# Patient Record
Sex: Male | Born: 1955 | Race: White | Hispanic: No | Smoking: Current every day smoker
Health system: Southern US, Community
[De-identification: ages and names within clinical notes are randomized; demographics above are authoritative.]

## PROBLEM LIST (undated history)

## (undated) DIAGNOSIS — F0390 Unspecified dementia without behavioral disturbance: Secondary | ICD-10-CM

## (undated) DIAGNOSIS — F32A Depression, unspecified: Secondary | ICD-10-CM

## (undated) DIAGNOSIS — F329 Major depressive disorder, single episode, unspecified: Secondary | ICD-10-CM

## (undated) DIAGNOSIS — I10 Essential (primary) hypertension: Secondary | ICD-10-CM

## (undated) DIAGNOSIS — S069X9A Unspecified intracranial injury with loss of consciousness of unspecified duration, initial encounter: Secondary | ICD-10-CM

## (undated) DIAGNOSIS — Z720 Tobacco use: Secondary | ICD-10-CM

## (undated) DIAGNOSIS — F1910 Other psychoactive substance abuse, uncomplicated: Secondary | ICD-10-CM

## (undated) DIAGNOSIS — M1990 Unspecified osteoarthritis, unspecified site: Secondary | ICD-10-CM

## (undated) DIAGNOSIS — S069XAA Unspecified intracranial injury with loss of consciousness status unknown, initial encounter: Secondary | ICD-10-CM

## (undated) HISTORY — DX: Other psychoactive substance abuse, uncomplicated: F19.10

## (undated) HISTORY — DX: Depression, unspecified: F32.A

## (undated) HISTORY — PX: BACK SURGERY: SHX140

## (undated) HISTORY — DX: Essential (primary) hypertension: I10

## (undated) HISTORY — DX: Unspecified osteoarthritis, unspecified site: M19.90

## (undated) HISTORY — PX: OTHER SURGICAL HISTORY: SHX169

---
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
## 2019-02-15 ENCOUNTER — Other Ambulatory Visit: Payer: Self-pay

## 2019-02-15 ENCOUNTER — Encounter (HOSPITAL_COMMUNITY): Payer: Self-pay | Admitting: Emergency Medicine

## 2019-02-15 ENCOUNTER — Inpatient Hospital Stay (HOSPITAL_COMMUNITY)
Admission: EM | Admit: 2019-02-15 | Discharge: 2019-02-18 | DRG: 982 | Disposition: A | Discharge status: Home / Self Care | Payer: No Typology Code available for payment source | Attending: Internal Medicine | Admitting: Internal Medicine

## 2019-02-15 ENCOUNTER — Emergency Department (HOSPITAL_COMMUNITY): Payer: No Typology Code available for payment source

## 2019-02-15 DIAGNOSIS — F329 Major depressive disorder, single episode, unspecified: Secondary | ICD-10-CM | POA: Diagnosis present

## 2019-02-15 DIAGNOSIS — K922 Gastrointestinal hemorrhage, unspecified: Secondary | ICD-10-CM | POA: Diagnosis not present

## 2019-02-15 DIAGNOSIS — K920 Hematemesis: Secondary | ICD-10-CM | POA: Diagnosis present

## 2019-02-15 DIAGNOSIS — D72829 Elevated white blood cell count, unspecified: Secondary | ICD-10-CM | POA: Diagnosis present

## 2019-02-15 DIAGNOSIS — K226 Gastro-esophageal laceration-hemorrhage syndrome: Principal | ICD-10-CM | POA: Diagnosis present

## 2019-02-15 DIAGNOSIS — Y92410 Unspecified street and highway as the place of occurrence of the external cause: Secondary | ICD-10-CM

## 2019-02-15 DIAGNOSIS — K2970 Gastritis, unspecified, without bleeding: Secondary | ICD-10-CM | POA: Diagnosis present

## 2019-02-15 DIAGNOSIS — M1990 Unspecified osteoarthritis, unspecified site: Secondary | ICD-10-CM | POA: Diagnosis present

## 2019-02-15 DIAGNOSIS — Z720 Tobacco use: Secondary | ICD-10-CM | POA: Diagnosis present

## 2019-02-15 DIAGNOSIS — I700 Atherosclerosis of aorta: Secondary | ICD-10-CM | POA: Diagnosis present

## 2019-02-15 DIAGNOSIS — J439 Emphysema, unspecified: Secondary | ICD-10-CM | POA: Diagnosis present

## 2019-02-15 DIAGNOSIS — Z87820 Personal history of traumatic brain injury: Secondary | ICD-10-CM

## 2019-02-15 DIAGNOSIS — G4733 Obstructive sleep apnea (adult) (pediatric): Secondary | ICD-10-CM | POA: Diagnosis present

## 2019-02-15 DIAGNOSIS — S0101XA Laceration without foreign body of scalp, initial encounter: Secondary | ICD-10-CM | POA: Diagnosis present

## 2019-02-15 DIAGNOSIS — Z20828 Contact with and (suspected) exposure to other viral communicable diseases: Secondary | ICD-10-CM | POA: Diagnosis present

## 2019-02-15 DIAGNOSIS — R911 Solitary pulmonary nodule: Secondary | ICD-10-CM | POA: Diagnosis present

## 2019-02-15 DIAGNOSIS — K2210 Ulcer of esophagus without bleeding: Secondary | ICD-10-CM | POA: Diagnosis present

## 2019-02-15 DIAGNOSIS — M545 Low back pain: Secondary | ICD-10-CM | POA: Diagnosis present

## 2019-02-15 DIAGNOSIS — F0390 Unspecified dementia without behavioral disturbance: Secondary | ICD-10-CM | POA: Diagnosis present

## 2019-02-15 DIAGNOSIS — M25551 Pain in right hip: Secondary | ICD-10-CM | POA: Diagnosis present

## 2019-02-15 DIAGNOSIS — R519 Headache, unspecified: Secondary | ICD-10-CM | POA: Diagnosis present

## 2019-02-15 DIAGNOSIS — S61412A Laceration without foreign body of left hand, initial encounter: Secondary | ICD-10-CM | POA: Diagnosis present

## 2019-02-15 DIAGNOSIS — S01111A Laceration without foreign body of right eyelid and periocular area, initial encounter: Secondary | ICD-10-CM | POA: Diagnosis present

## 2019-02-15 DIAGNOSIS — F17210 Nicotine dependence, cigarettes, uncomplicated: Secondary | ICD-10-CM | POA: Diagnosis present

## 2019-02-15 DIAGNOSIS — M25511 Pain in right shoulder: Secondary | ICD-10-CM

## 2019-02-15 DIAGNOSIS — S41112A Laceration without foreign body of left upper arm, initial encounter: Secondary | ICD-10-CM | POA: Diagnosis present

## 2019-02-15 DIAGNOSIS — Z95828 Presence of other vascular implants and grafts: Secondary | ICD-10-CM

## 2019-02-15 DIAGNOSIS — F1210 Cannabis abuse, uncomplicated: Secondary | ICD-10-CM | POA: Diagnosis present

## 2019-02-15 DIAGNOSIS — Z79899 Other long term (current) drug therapy: Secondary | ICD-10-CM

## 2019-02-15 DIAGNOSIS — Z23 Encounter for immunization: Secondary | ICD-10-CM

## 2019-02-15 DIAGNOSIS — T07XXXA Unspecified multiple injuries, initial encounter: Secondary | ICD-10-CM | POA: Diagnosis present

## 2019-02-15 DIAGNOSIS — S0181XA Laceration without foreign body of other part of head, initial encounter: Secondary | ICD-10-CM

## 2019-02-15 DIAGNOSIS — K2980 Duodenitis without bleeding: Secondary | ICD-10-CM | POA: Diagnosis present

## 2019-02-15 DIAGNOSIS — D62 Acute posthemorrhagic anemia: Secondary | ICD-10-CM | POA: Diagnosis present

## 2019-02-15 HISTORY — DX: Unspecified dementia, unspecified severity, without behavioral disturbance, psychotic disturbance, mood disturbance, and anxiety: F03.90

## 2019-02-15 HISTORY — DX: Unspecified intracranial injury with loss of consciousness status unknown, initial encounter: S06.9XAA

## 2019-02-15 HISTORY — DX: Unspecified intracranial injury with loss of consciousness of unspecified duration, initial encounter: S06.9X9A

## 2019-02-15 HISTORY — DX: Tobacco use: Z72.0

## 2019-02-15 LAB — CBC WITH DIFFERENTIAL/PLATELET
Abs Immature Granulocytes: 0.07 10*3/uL (ref 0.00–0.07)
Basophils Absolute: 0.1 10*3/uL (ref 0.0–0.1)
Basophils Relative: 1 %
Eosinophils Absolute: 0.5 10*3/uL (ref 0.0–0.5)
Eosinophils Relative: 4 %
HCT: 40.2 % (ref 39.0–52.0)
Hemoglobin: 13.3 g/dL (ref 13.0–17.0)
Immature Granulocytes: 1 %
Lymphocytes Relative: 18 %
Lymphs Abs: 2.3 10*3/uL (ref 0.7–4.0)
MCH: 33.2 pg (ref 26.0–34.0)
MCHC: 33.1 g/dL (ref 30.0–36.0)
MCV: 100.2 fL — ABNORMAL HIGH (ref 80.0–100.0)
Monocytes Absolute: 1.1 10*3/uL — ABNORMAL HIGH (ref 0.1–1.0)
Monocytes Relative: 9 %
Neutro Abs: 9.1 10*3/uL — ABNORMAL HIGH (ref 1.7–7.7)
Neutrophils Relative %: 67 %
Platelets: 275 10*3/uL (ref 150–400)
RBC: 4.01 MIL/uL — ABNORMAL LOW (ref 4.22–5.81)
RDW: 12.8 % (ref 11.5–15.5)
WBC: 13.2 10*3/uL — ABNORMAL HIGH (ref 4.0–10.5)
nRBC: 0 % (ref 0.0–0.2)

## 2019-02-15 LAB — COMPREHENSIVE METABOLIC PANEL
ALT: 13 U/L (ref 0–44)
AST: 18 U/L (ref 15–41)
Albumin: 3.7 g/dL (ref 3.5–5.0)
Alkaline Phosphatase: 79 U/L (ref 38–126)
Anion gap: 8 (ref 5–15)
BUN: 19 mg/dL (ref 8–23)
CO2: 26 mmol/L (ref 22–32)
Calcium: 9.2 mg/dL (ref 8.9–10.3)
Chloride: 103 mmol/L (ref 98–111)
Creatinine, Ser: 1.21 mg/dL (ref 0.61–1.24)
GFR calc Af Amer: >60 mL/min (ref >60)
GFR calc non Af Amer: >60 mL/min (ref >60)
Glucose, Bld: 105 mg/dL — ABNORMAL HIGH (ref 70–99)
Potassium: 4.9 mmol/L (ref 3.5–5.1)
Sodium: 137 mmol/L (ref 135–145)
Total Bilirubin: 0.6 mg/dL (ref 0.3–1.2)
Total Protein: 6.4 g/dL — ABNORMAL LOW (ref 6.5–8.1)

## 2019-02-15 MED ORDER — FENTANYL CITRATE (PF) 100 MCG/2ML IJ SOLN
50.0000 ug | Freq: Once | INTRAMUSCULAR | Status: AC
  Administered 2019-02-15: 50 ug via INTRAVENOUS
  Filled 2019-02-15: qty 2

## 2019-02-15 MED ORDER — IOHEXOL 300 MG/ML  SOLN
100.0000 mL | Freq: Once | INTRAMUSCULAR | Status: AC | PRN
  Administered 2019-02-15: 100 mL via INTRAVENOUS

## 2019-02-15 MED ORDER — TETANUS-DIPHTH-ACELL PERTUSSIS 5-2.5-18.5 LF-MCG/0.5 IM SUSP
0.5000 mL | Freq: Once | INTRAMUSCULAR | Status: AC
  Administered 2019-02-15: 0.5 mL via INTRAMUSCULAR
  Filled 2019-02-15: qty 0.5

## 2019-02-15 MED ORDER — LIDOCAINE-EPINEPHRINE 1 %-1:100000 IJ SOLN
30.0000 mL | Freq: Once | INTRAMUSCULAR | Status: AC
  Administered 2019-02-15: 30 mL
  Filled 2019-02-15: qty 30

## 2019-02-15 NOTE — ED Notes (Signed)
Lacerations cleaned with peroxide and soap and water to his face rt eyebrow  Lt face multiple small cuts to his rt arm  Mostly hand and forearm  Also  Peroxide and water to the cuts on his entire lt arm  Minimal bleeding
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
## 2019-02-15 NOTE — ED Notes (Signed)
Patient transported to X-ray 

## 2019-02-15 NOTE — ED Notes (Signed)
In xray
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
## 2019-02-16 ENCOUNTER — Encounter (HOSPITAL_COMMUNITY): Payer: Self-pay | Admitting: Internal Medicine

## 2019-02-16 ENCOUNTER — Observation Stay (HOSPITAL_COMMUNITY): Payer: No Typology Code available for payment source | Admitting: Anesthesiology

## 2019-02-16 ENCOUNTER — Encounter (HOSPITAL_COMMUNITY)
Admission: EM | Disposition: A | Discharge status: Home / Self Care | Payer: Self-pay | Source: Home / Self Care | Attending: Family Medicine

## 2019-02-16 DIAGNOSIS — K920 Hematemesis: Secondary | ICD-10-CM

## 2019-02-16 DIAGNOSIS — Z720 Tobacco use: Secondary | ICD-10-CM

## 2019-02-16 DIAGNOSIS — K2970 Gastritis, unspecified, without bleeding: Secondary | ICD-10-CM

## 2019-02-16 DIAGNOSIS — D72829 Elevated white blood cell count, unspecified: Secondary | ICD-10-CM | POA: Diagnosis present

## 2019-02-16 DIAGNOSIS — K226 Gastro-esophageal laceration-hemorrhage syndrome: Principal | ICD-10-CM

## 2019-02-16 DIAGNOSIS — T07XXXA Unspecified multiple injuries, initial encounter: Secondary | ICD-10-CM | POA: Diagnosis present

## 2019-02-16 DIAGNOSIS — K2980 Duodenitis without bleeding: Secondary | ICD-10-CM

## 2019-02-16 DIAGNOSIS — Z87820 Personal history of traumatic brain injury: Secondary | ICD-10-CM

## 2019-02-16 HISTORY — PX: HEMOSTASIS CLIP PLACEMENT: SHX6857

## 2019-02-16 HISTORY — PX: BIOPSY: SHX5522

## 2019-02-16 HISTORY — PX: ESOPHAGOGASTRODUODENOSCOPY: SHX5428

## 2019-02-16 LAB — TYPE AND SCREEN
ABO/RH(D): A POS
Antibody Screen: NEGATIVE

## 2019-02-16 LAB — CBC
HCT: 40.6 % (ref 39.0–52.0)
Hemoglobin: 13.8 g/dL (ref 13.0–17.0)
MCH: 33.7 pg (ref 26.0–34.0)
MCHC: 34 g/dL (ref 30.0–36.0)
MCV: 99.3 fL (ref 80.0–100.0)
Platelets: 250 10*3/uL (ref 150–400)
RBC: 4.09 MIL/uL — ABNORMAL LOW (ref 4.22–5.81)
RDW: 12.8 % (ref 11.5–15.5)
WBC: 13.6 10*3/uL — ABNORMAL HIGH (ref 4.0–10.5)
nRBC: 0 % (ref 0.0–0.2)

## 2019-02-16 LAB — PROTIME-INR
INR: 0.9 (ref 0.8–1.2)
Prothrombin Time: 12 seconds (ref 11.4–15.2)

## 2019-02-16 LAB — HEMOGLOBIN AND HEMATOCRIT, BLOOD
HCT: 38.8 % — ABNORMAL LOW (ref 39.0–52.0)
Hemoglobin: 13 g/dL (ref 13.0–17.0)

## 2019-02-16 LAB — ABO/RH: ABO/RH(D): A POS

## 2019-02-16 LAB — HIV ANTIBODY (ROUTINE TESTING W REFLEX): HIV Screen 4th Generation wRfx: NONREACTIVE

## 2019-02-16 LAB — SARS CORONAVIRUS 2 BY RT PCR (HOSPITAL ORDER, PERFORMED IN ~~LOC~~ HOSPITAL LAB): SARS Coronavirus 2: NEGATIVE

## 2019-02-16 SURGERY — EGD (ESOPHAGOGASTRODUODENOSCOPY)
Anesthesia: Monitor Anesthesia Care

## 2019-02-16 MED ORDER — FENTANYL CITRATE (PF) 100 MCG/2ML IJ SOLN
25.0000 ug | INTRAMUSCULAR | Status: DC | PRN
  Administered 2019-02-16 – 2019-02-17 (×5): 25 ug via INTRAVENOUS
  Filled 2019-02-16 (×5): qty 2

## 2019-02-16 MED ORDER — SODIUM CHLORIDE 0.9 % IV SOLN
8.0000 mg/h | INTRAVENOUS | Status: DC
  Administered 2019-02-16 – 2019-02-18 (×5): 8 mg/h via INTRAVENOUS
  Filled 2019-02-16 (×5): qty 80

## 2019-02-16 MED ORDER — LIDOCAINE 2% (20 MG/ML) 5 ML SYRINGE
INTRAMUSCULAR | Status: DC | PRN
  Administered 2019-02-16 (×2): 20 mg via INTRAVENOUS

## 2019-02-16 MED ORDER — SODIUM CHLORIDE 0.9 % IV SOLN
Freq: Once | INTRAVENOUS | Status: AC
  Administered 2019-02-16: 10:00:00 via INTRAVENOUS

## 2019-02-16 MED ORDER — SUCRALFATE 1 GM/10ML PO SUSP
1.0000 g | Freq: Three times a day (TID) | ORAL | Status: DC
  Administered 2019-02-16 – 2019-02-18 (×8): 1 g via ORAL
  Filled 2019-02-16 (×10): qty 10

## 2019-02-16 MED ORDER — SODIUM CHLORIDE 0.9 % IV SOLN
8.0000 mg | Freq: Three times a day (TID) | INTRAVENOUS | Status: DC
  Administered 2019-02-16 – 2019-02-18 (×6): 8 mg via INTRAVENOUS
  Filled 2019-02-16 (×11): qty 4

## 2019-02-16 MED ORDER — ONDANSETRON HCL 4 MG/2ML IJ SOLN: 4.0000 mg | Freq: Four times a day (QID) | INTRAMUSCULAR | Status: DC | PRN

## 2019-02-16 MED ORDER — SODIUM CHLORIDE 0.9 % IV BOLUS (SEPSIS)
1000.0000 mL | Freq: Once | INTRAVENOUS | Status: AC
  Administered 2019-02-16: 1000 mL via INTRAVENOUS

## 2019-02-16 MED ORDER — LACTATED RINGERS IV SOLN
INTRAVENOUS | Status: AC | PRN
  Administered 2019-02-16: 1000 mL via INTRAVENOUS

## 2019-02-16 MED ORDER — ONDANSETRON HCL 4 MG/2ML IJ SOLN
4.0000 mg | Freq: Once | INTRAMUSCULAR | Status: AC
  Administered 2019-02-16: 4 mg via INTRAVENOUS
  Filled 2019-02-16: qty 2

## 2019-02-16 MED ORDER — ONDANSETRON HCL 4 MG PO TABS: 4.0000 mg | ORAL_TABLET | Freq: Four times a day (QID) | ORAL | Status: DC | PRN

## 2019-02-16 MED ORDER — METOCLOPRAMIDE HCL 5 MG/ML IJ SOLN
10.0000 mg | Freq: Two times a day (BID) | INTRAMUSCULAR | Status: DC | PRN
  Filled 2019-02-16: qty 2

## 2019-02-16 MED ORDER — SODIUM CHLORIDE 0.9 % IV SOLN: INTRAVENOUS | Status: DC

## 2019-02-16 MED ORDER — ONDANSETRON HCL 4 MG/2ML IJ SOLN
INTRAMUSCULAR | Status: AC
  Administered 2019-02-16: 4 mg
  Filled 2019-02-16: qty 2

## 2019-02-16 MED ORDER — METOCLOPRAMIDE HCL 5 MG/ML IJ SOLN
10.0000 mg | Freq: Once | INTRAMUSCULAR | Status: AC
  Administered 2019-02-16: 10 mg via INTRAVENOUS
  Filled 2019-02-16: qty 2

## 2019-02-16 MED ORDER — NICOTINE 21 MG/24HR TD PT24
21.0000 mg | MEDICATED_PATCH | Freq: Every day | TRANSDERMAL | Status: DC
  Administered 2019-02-16 – 2019-02-18 (×3): 21 mg via TRANSDERMAL
  Filled 2019-02-16 (×3): qty 1

## 2019-02-16 MED ORDER — ALBUTEROL SULFATE (2.5 MG/3ML) 0.083% IN NEBU: 2.5000 mg | INHALATION_SOLUTION | Freq: Four times a day (QID) | RESPIRATORY_TRACT | Status: DC | PRN

## 2019-02-16 MED ORDER — SODIUM CHLORIDE 0.9% FLUSH
3.0000 mL | Freq: Two times a day (BID) | INTRAVENOUS | Status: DC
  Administered 2019-02-16 – 2019-02-17 (×2): 3 mL via INTRAVENOUS

## 2019-02-16 MED ORDER — BUTAMBEN-TETRACAINE-BENZOCAINE 2-2-14 % EX AERO
INHALATION_SPRAY | CUTANEOUS | Status: DC | PRN
  Administered 2019-02-16: 1 via TOPICAL

## 2019-02-16 MED ORDER — PROPOFOL 500 MG/50ML IV EMUL
INTRAVENOUS | Status: DC | PRN
  Administered 2019-02-16 (×5): 200 ug via INTRAVENOUS
  Administered 2019-02-16: 300 ug via INTRAVENOUS

## 2019-02-16 MED ORDER — ACETAMINOPHEN 325 MG PO TABS
650.0000 mg | ORAL_TABLET | Freq: Four times a day (QID) | ORAL | Status: DC | PRN
  Administered 2019-02-17: 650 mg via ORAL
  Filled 2019-02-16: qty 2

## 2019-02-16 MED ORDER — ACETAMINOPHEN 650 MG RE SUPP: 650.0000 mg | Freq: Four times a day (QID) | RECTAL | Status: DC | PRN

## 2019-02-16 MED ORDER — PANTOPRAZOLE SODIUM 40 MG IV SOLR: 40.0000 mg | Freq: Two times a day (BID) | INTRAVENOUS | Status: DC

## 2019-02-16 MED ORDER — LACTATED RINGERS IV SOLN
INTRAVENOUS | Status: DC | PRN
  Administered 2019-02-16: 13:00:00 via INTRAVENOUS

## 2019-02-16 MED ORDER — PANTOPRAZOLE SODIUM 40 MG IV SOLR
40.0000 mg | Freq: Once | INTRAVENOUS | Status: AC
  Administered 2019-02-16: 40 mg via INTRAVENOUS
  Filled 2019-02-16: qty 40

## 2019-02-16 NOTE — ED Provider Notes (Signed)
Discussed with Dr. Tamala Julian with [facility].  He will admit the patient.  We discussed GI recommendations.  He requests trauma consultation   Ripley Fraise, MD 02/16/19 [phone]

## 2019-02-16 NOTE — Progress Notes (Signed)
NEW ADMISSION NOTE New Admission Note:   Arrival Method: E.D. stretcher bed Mental Orientation: Alert ,oriented  x4  Telemetry: Called and confirmed Assessment: Completed Skin:He has a lacarated sutured wound on top of his head and lacerated sutured wound at the right eyebrow,both are expose to air.Lacerated sutured wound on the left upper arm which is dressed with Kerlix.Mutiple bruise areas on his forehead,tip of the nose and four extremities.Skin assessed with Ulice Dash R.N. IV: Right upper arm Pain:Denies Tubes:None Safety Measures: Safety Fall Prevention Plan has been given, discussed and signed Admission: Completed [facility] Orientation: Patient has been orientated to the room, unit and staff.  Family:None at the bedside.  Orders have been reviewed and implemented. Will continue to monitor the patient. Call light has been placed within reach and bed alarm has been activated.   Moriarty, Zenon Mayo, RN
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
## 2019-02-16 NOTE — ED Notes (Signed)
Pt vomited about 50 to 75cc of a reddish Izekiel Flegel liquid on the floor. He was given a suction canister and vomited another 100cc reddish Karsynn Deweese liquid in the canister. RN Benjamine Mola and Dr Christy Gentles is informed. Dr Christy Gentles comes and talkes to pt.

## 2019-02-16 NOTE — Anesthesia Preprocedure Evaluation (Addendum)
Anesthesia Evaluation  Patient identified by MRN, date of birth, ID band Patient awake    Reviewed: Allergy & Precautions, NPO status , Patient's Chart, lab work & pertinent test results  Airway Mallampati: I  TM Distance: >3 FB Neck ROM: Full    Dental  (+) Edentulous Lower, Edentulous Upper   Pulmonary Current Smoker,    Pulmonary exam normal        Cardiovascular Normal cardiovascular exam     Neuro/Psych Dementia    GI/Hepatic   Endo/Other    Renal/GU      Musculoskeletal   Abdominal   Peds  Hematology   Anesthesia Other Findings   Reproductive/Obstetrics                            Anesthesia Physical Anesthesia Plan  ASA: III  Anesthesia Plan: MAC   Post-op Pain Management:    Induction: Intravenous  PONV Risk Score and Plan: 0  Airway Management Planned: Simple Face Mask and Nasal Cannula  Additional Equipment:   Intra-op Plan:   Post-operative Plan:   Informed Consent: I have reviewed the patients History and Physical, chart, labs and discussed the procedure including the risks, benefits and alternatives for the proposed anesthesia with the patient or authorized representative who has indicated his/her understanding and acceptance.       Plan Discussed with: CRNA and Surgeon  Anesthesia Plan Comments:         Anesthesia Quick Evaluation

## 2019-02-16 NOTE — H&P (Signed)
GASTROENTEROLOGY PROCEDURE H&P NOTE   Primary Care Physician: Patient, No Pcp Per  HPI: Willie Harmon is a 63 y.o. male who presents for EGD.  Past Medical History:  Diagnosis Date  . Dementia ([place])   . Tobacco abuse   . Traumatic brain injury [facility])    Past Surgical History:  Procedure Laterality Date  . BACK SURGERY     Notes having 2 prior back surgeries.  . Metal plate in right leg     Current Facility-Administered Medications  Medication Dose Route Frequency Provider Last Rate Last Dose  . 0.9 %  sodium chloride infusion   Intravenous Continuous Mansouraty, Telford Nab., MD      . Doug Sou Hold] 0.9 %  sodium chloride infusion   Intravenous Once Norval Morton, MD      . Doug Sou Hold] acetaminophen (TYLENOL) tablet 650 mg  650 mg Oral Q6H PRN Norval Morton, MD       Or  . Doug Sou Hold] acetaminophen (TYLENOL) suppository 650 mg  650 mg Rectal Q6H PRN Norval Morton, MD      . Doug Sou Hold] albuterol (PROVENTIL) (2.5 MG/3ML) 0.083% nebulizer solution 2.5 mg  2.5 mg Nebulization Q6H PRN Norval Morton, MD      . Doug Sou Hold] fentaNYL (SUBLIMAZE) injection 25 mcg  25 mcg Intravenous Q3H PRN Fuller Plan A, MD   25 mcg at 02/16/19 0841  . lactated ringers infusion    Continuous PRN Mansouraty, Telford Nab., MD   1,000 mL at 02/16/19 1220  . lactated ringers infusion    Continuous PRN Mansouraty, Telford Nab., MD   1,000 mL at 02/16/19 1248  . [MAR Hold] nicotine (NICODERM CQ - dosed in mg/24 hours) patch 21 mg  21 mg Transdermal Daily Smith, Rondell A, MD      . Doug Sou Hold] ondansetron (ZOFRAN) tablet 4 mg  4 mg Oral Q6H PRN Norval Morton, MD       Or  . Doug Sou Hold] ondansetron (ZOFRAN) injection 4 mg  4 mg Intravenous Q6H PRN Norval Morton, MD      . Doug Sou Hold] pantoprazole (PROTONIX) injection 40 mg  40 mg Intravenous Q12H Smith, Rondell A, MD      . Doug Sou Hold] sodium chloride flush (NS) 0.9 % injection 3 mL  3 mL Intravenous Q12H Smith, Rondell A, MD       No Known  Allergies Family History  Problem Relation Age of Onset  . Cancer Mother    Social History   Socioeconomic History  . Marital status: Divorced    Spouse name: Not on file  . Number of children: Not on file  . Years of education: Not on file  . Highest education level: Not on file  Occupational History  . Not on file  Social Needs  . Financial resource strain: Not on file  . Food insecurity    Worry: Not on file    Inability: Not on file  . Transportation needs    Medical: Not on file    Non-medical: Not on file  Tobacco Use  . Smoking status: Current Every Day Smoker    Packs/day: 1.00    Types: Cigarettes  . Smokeless tobacco: Never Used  Substance and Sexual Activity  . Alcohol use: Yes    Comment: Reports occasional, but not daily use  . Drug use: Yes    Types: Marijuana  . Sexual activity: Not on file  Lifestyle  . Physical activity  Days per week: Not on file    Minutes per session: Not on file  . Stress: Not on file  Relationships  . Social Herbalist on phone: Not on file    Gets together: Not on file    Attends religious service: Not on file    Active member of club or organization: Not on file    Attends meetings of clubs or organizations: Not on file    Relationship status: Not on file  . Intimate partner violence    Fear of current or ex partner: Not on file    Emotionally abused: Not on file    Physically abused: Not on file    Forced sexual activity: Not on file  Other Topics Concern  . Not on file  Social History Narrative  . Not on file    Physical Exam: Vital signs in last 24 hours: Temp:  [98.3 F (36.8 C)-98.9 F (37.2 C)] 98.9 F (37.2 C) (11/01 1218) Pulse Rate:  [49-63] 49 (11/01 0938) Resp:  [8-18] 17 (11/01 1218) BP: (137-172)/(71-83) 139/72 (11/01 1218) SpO2:  [97 %-100 %] 97 % (11/01 1218) Weight:  [77.1 kg] 77.1 kg (11/01 1218)   GEN: NAD EYE: Sclerae anicteric ENT: MMM CV: Non-tachycardic GI: Soft, mild  TTP in midabdomen NEURO:  Alert & Oriented x 3  Lab Results: Recent Labs    02/15/19 1723 02/16/19 0453 02/16/19 0938  WBC 13.2* 13.6*  --   HGB 13.3 13.8 13.0  HCT 40.2 40.6 38.8*  PLT 275 250  --    BMET Recent Labs    02/15/19 1723  NA 137  K 4.9  CL 103  CO2 26  GLUCOSE 105*  BUN 19  CREATININE 1.21  CALCIUM 9.2   LFT Recent Labs    02/15/19 1723  PROT 6.4*  ALBUMIN 3.7  AST 18  ALT 13  ALKPHOS 79  BILITOT 0.6   PT/INR Recent Labs    02/16/19 0704  LABPROT 12.0  INR 0.9     Impression / Plan: This is a 63 y.o.male who presents for EGD.  The risks and benefits of endoscopic evaluation were discussed with the patient; these include but are not limited to the risk of perforation, infection, bleeding, missed lesions, lack of diagnosis, severe illness requiring hospitalization, as well as anesthesia and sedation related illnesses.  The patient is agreeable to proceed.    Justice Britain, MD [facility] # [phone]
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
## 2019-02-16 NOTE — Op Note (Signed)
[facility] Patient Name: Willie Harmon Procedure Date : 02/16/2019 MRN: [id number] Attending MD: Justice Britain , MD Date of Birth: 12/20/1955 CSN: [id number] Age: 63 Admit Type: Inpatient Procedure:                Upper GI endoscopy Indications:              Coffee-ground emesis, Hematemesis Providers:                Justice Britain, MD, Elmer Ramp. Tilden Dome, RN, Lazaro Arms, Technician Referring MD:             [facility] Medicines:                Monitored Anesthesia Care Complications:            No immediate complications. Estimated Blood Loss:     Estimated blood loss was minimal. Procedure:                Pre-Anesthesia Assessment:                           - Prior to the procedure, a History and Physical                            was performed, and patient medications and                            allergies were reviewed. The patient's tolerance of                            previous anesthesia was also reviewed. The risks                            and benefits of the procedure and the sedation                            options and risks were discussed with the patient.                            All questions were answered, and informed consent                            was obtained. Prior Anticoagulants: The patient has                            taken no previous anticoagulant or antiplatelet                            agents. ASA Grade Assessment: III - A patient with                            severe systemic disease. After reviewing the risks  and benefits, the patient was deemed in                            satisfactory condition to undergo the procedure.                           After obtaining informed consent, the endoscope was                            passed under direct vision. Throughout the                            procedure, the patient's blood pressure, pulse, and                       oxygen saturations were monitored continuously. The                            GIF-H190 ([id number]) Olympus gastroscope was                            introduced through the mouth, and advanced to the                            second part of duodenum. The upper GI endoscopy was                            accomplished without difficulty. The patient                            tolerated the procedure. Scope In: Scope Out: Findings:      No gross lesions were noted in the proximal esophagus and in the mid       esophagus.      LA Grade C (one or more mucosal breaks continuous between tops of 2 or       more mucosal folds, less than 75% circumference) esophagitis with no       bleeding was found in the distal esophagus.      A 10 mm non-bleeding Mallory-Weiss tear with stigmata of recent bleeding       was found. To repair the defect, the tissue edges were approximated and       three hemostatic clips were successfully placed (MR conditional).       Closure of the defect was successful. There was no bleeding during, or       at the end, of the procedure.      One cratered ulcer at the very distal esophagus into the proximal cardia       of the stomach with no bleeding was found. The lesion was 25 mm in       largest dimension. I attempted to place a clip in the region to see if I       could approximate the ulcer, but it was not possible due to the region       even in a retroflexed position. This does not have typical appearance of       a malignancy but will need close follow up to ensure  healing.      Hematin (altered blood/coffee-ground-like material) was found in the       cardia, in the gastric fundus and in the gastric body. Suction via       Endoscope was performed >750 cc of fluid and clot.      Patchy mild inflammation characterized by erosions, erythema and       granularity was found in the entire examined stomach. Biopsies were       taken with a cold forceps  for histology and Helicobacter pylori testing.      Diffuse moderate inflammation characterized by erosions, erythema,       friability and granularity was found in the duodenal bulb and in the       D1/D2 angle.      Normal mucosa was found in the second portion of the duodenum. Impression:               - No gross lesions in esophagus proximally. LA                            Grade C esophagitis distally.                           - Mallory-Weiss tear. Clips (MR conditional) were                            placed.                           - A large non-bleeding ulcer at the very distal                            esophagus/GE Junxtion into the proximal cardia was                            noted. Could not approximate this lesion closed.                            This does not have typical appearance of a                            malignancy but more likely as a result of retching,                            but will need close monitoring and follow up.                           - Hematin (altered blood/coffee-ground-like                            material) in the cardia, in the gastric fundus and                            in the gastric body. Suctioned with adequate                            visualization.                           -  Gastritis. Biopsied.                           - Duodenitis. Recommendation:           - The patient will be observed post-procedure,                            until all discharge criteria are met.                           - Return patient to hospital ward for ongoing care.                           - Aggressive Anti-emetics: Zofran 8 mg Q8H                            standing, Reglan 10 mg BID PRN                           - Carafate QAC + QHS x 41-month.                           - IV PPI drip x 24-hours at minimum then may                            attempt transition to IV PPI BID x 24-hours vs if                            he is doing very  well then transitioning to PO PPI                            BID.                           - Observe patient's clinical course.                           - Trend Hgb/Hct. Expect to see drop in Hgb in the                            next 24 hours as equilibration occurs.                           - Await pathology results.                           - Repeat upper endoscopy in 2 months to check                            healing.                           - Return to GI clinic.                           -  The findings and recommendations were discussed                            with the patient.                           - The findings and recommendations were discussed                            with the referring physician. Procedure Code(s):        --- Professional ---                           [id number], Esophagogastroduodenoscopy, flexible,                            transoral; with biopsy, single or multiple Diagnosis Code(s):        --- Professional ---                           K20.9, Esophagitis, unspecified                           K22.6, Gastro-esophageal laceration-hemorrhage                            syndrome                           K22.10, Ulcer of esophagus without bleeding                           K92.2, Gastrointestinal hemorrhage, unspecified                           K29.70, Gastritis, unspecified, without bleeding                           K29.80, Duodenitis without bleeding                           K92.0, Hematemesis CPT copyright 2019 [employer]. All rights reserved. The codes documented in this report are preliminary and upon coder review may  be revised to meet current compliance requirements. Justice Britain, MD 02/16/2019 2:11:58 PM Number of Addenda: 0

## 2019-02-16 NOTE — Transfer of Care (Signed)
Immediate Anesthesia Transfer of Care Note  Patient: Willie Harmon  Procedure(s) Performed: ESOPHAGOGASTRODUODENOSCOPY (EGD) (N/A ) BIOPSY HEMOSTASIS CLIP PLACEMENT  Patient Location: PACU  Anesthesia Type:MAC  Level of Consciousness: drowsy  Airway & Oxygen Therapy: Patient Spontanous Breathing and Patient connected to nasal cannula oxygen  Post-op Assessment: Report given to RN and Post -op Vital signs reviewed and stable  Post vital signs: Reviewed and stable  Last Vitals:  Vitals Value Taken Time  BP 113/45 02/16/19 1344  Temp    Pulse 67 02/16/19 1345  Resp 22 02/16/19 1345  SpO2 100 % 02/16/19 1345  Vitals shown include unvalidated device data.  Last Pain:  Vitals:   02/16/19 1218  TempSrc: Temporal  PainSc: 0-No pain         Complications: No apparent anesthesia complications

## 2019-02-16 NOTE — ED Provider Notes (Signed)
D/w dr Allison Quarry with [facility] He recommends reglan now, and reglan after admission Start protonix If GI bleed continues he will need NG tube with lavage    Ripley Fraise, MD 02/16/19 [phone]
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
## 2019-02-16 NOTE — Progress Notes (Signed)
Patient tolerated his lunch and dinner clear liquid diet.
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
## 2019-02-16 NOTE — Discharge Instructions (Addendum)

## 2019-02-16 NOTE — ED Provider Notes (Signed)
Attempted p.o. challenge was unsuccessful.  Patient had an immediate bout of bright red bloody emesis that was large-volume.  His rectal exam revealed no blood or melena, Hemoccult negative.  Patient denies any history of ulcer or gastritis.  Denies history of NSAID abuse.  No history of liver disease. He continues to deny anticoagulation  Patient has failed p.o. trial, will need to be admitted to the hospital   Ripley Fraise, MD 02/16/19 [phone]

## 2019-02-16 NOTE — Anesthesia Postprocedure Evaluation (Signed)
Anesthesia Post Note  Patient: Willie Harmon  Procedure(s) Performed: ESOPHAGOGASTRODUODENOSCOPY (EGD) (N/A ) [place]     Patient location during evaluation: PACU Anesthesia Type: MAC Level of consciousness: awake and alert Pain management: pain level controlled Vital Signs Assessment: post-procedure vital signs reviewed and stable Respiratory status: spontaneous breathing, nonlabored ventilation, respiratory function stable and patient connected to nasal cannula oxygen Cardiovascular status: stable and blood pressure returned to baseline Postop Assessment: no apparent nausea or vomiting Anesthetic complications: no    Last Vitals:  Vitals:   02/16/19 1350 02/16/19 1353  BP:  (!) 117/53  Pulse: 70 62  Resp: (!) 22 19  Temp:    SpO2: 100% 97%    Last Pain:  Vitals:   02/16/19 1353  TempSrc:   PainSc: 0-No pain                 Autumm Hattery DAVID
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
## 2019-02-17 ENCOUNTER — Telehealth: Payer: Self-pay

## 2019-02-17 DIAGNOSIS — Z95828 Presence of other vascular implants and grafts: Secondary | ICD-10-CM | POA: Diagnosis not present

## 2019-02-17 DIAGNOSIS — M545 Low back pain: Secondary | ICD-10-CM | POA: Diagnosis present

## 2019-02-17 DIAGNOSIS — K2980 Duodenitis without bleeding: Secondary | ICD-10-CM | POA: Diagnosis present

## 2019-02-17 DIAGNOSIS — S61412A Laceration without foreign body of left hand, initial encounter: Secondary | ICD-10-CM | POA: Diagnosis present

## 2019-02-17 DIAGNOSIS — F17210 Nicotine dependence, cigarettes, uncomplicated: Secondary | ICD-10-CM | POA: Diagnosis present

## 2019-02-17 DIAGNOSIS — F329 Major depressive disorder, single episode, unspecified: Secondary | ICD-10-CM | POA: Diagnosis present

## 2019-02-17 DIAGNOSIS — M1990 Unspecified osteoarthritis, unspecified site: Secondary | ICD-10-CM | POA: Diagnosis present

## 2019-02-17 DIAGNOSIS — J439 Emphysema, unspecified: Secondary | ICD-10-CM | POA: Diagnosis present

## 2019-02-17 DIAGNOSIS — D62 Acute posthemorrhagic anemia: Secondary | ICD-10-CM | POA: Diagnosis present

## 2019-02-17 DIAGNOSIS — I700 Atherosclerosis of aorta: Secondary | ICD-10-CM | POA: Diagnosis present

## 2019-02-17 DIAGNOSIS — G4733 Obstructive sleep apnea (adult) (pediatric): Secondary | ICD-10-CM | POA: Diagnosis present

## 2019-02-17 DIAGNOSIS — S0101XA Laceration without foreign body of scalp, initial encounter: Secondary | ICD-10-CM | POA: Diagnosis present

## 2019-02-17 DIAGNOSIS — K922 Gastrointestinal hemorrhage, unspecified: Secondary | ICD-10-CM | POA: Diagnosis present

## 2019-02-17 DIAGNOSIS — S01111A Laceration without foreign body of right eyelid and periocular area, initial encounter: Secondary | ICD-10-CM | POA: Diagnosis present

## 2019-02-17 DIAGNOSIS — K2970 Gastritis, unspecified, without bleeding: Secondary | ICD-10-CM | POA: Diagnosis present

## 2019-02-17 DIAGNOSIS — Y92410 Unspecified street and highway as the place of occurrence of the external cause: Secondary | ICD-10-CM | POA: Diagnosis not present

## 2019-02-17 DIAGNOSIS — K226 Gastro-esophageal laceration-hemorrhage syndrome: Secondary | ICD-10-CM | POA: Diagnosis present

## 2019-02-17 DIAGNOSIS — R911 Solitary pulmonary nodule: Secondary | ICD-10-CM | POA: Diagnosis present

## 2019-02-17 DIAGNOSIS — R519 Headache, unspecified: Secondary | ICD-10-CM | POA: Diagnosis present

## 2019-02-17 DIAGNOSIS — Z20828 Contact with and (suspected) exposure to other viral communicable diseases: Secondary | ICD-10-CM | POA: Diagnosis present

## 2019-02-17 DIAGNOSIS — S41112A Laceration without foreign body of left upper arm, initial encounter: Secondary | ICD-10-CM | POA: Diagnosis present

## 2019-02-17 DIAGNOSIS — K2210 Ulcer of esophagus without bleeding: Secondary | ICD-10-CM | POA: Diagnosis present

## 2019-02-17 DIAGNOSIS — F1210 Cannabis abuse, uncomplicated: Secondary | ICD-10-CM | POA: Diagnosis present

## 2019-02-17 DIAGNOSIS — Z23 Encounter for immunization: Secondary | ICD-10-CM | POA: Diagnosis not present

## 2019-02-17 DIAGNOSIS — M25551 Pain in right hip: Secondary | ICD-10-CM | POA: Diagnosis present

## 2019-02-17 DIAGNOSIS — F0390 Unspecified dementia without behavioral disturbance: Secondary | ICD-10-CM | POA: Diagnosis present

## 2019-02-17 LAB — CBC WITH DIFFERENTIAL/PLATELET
Abs Immature Granulocytes: 0.04 10*3/uL (ref 0.00–0.07)
Basophils Absolute: 0 10*3/uL (ref 0.0–0.1)
Basophils Relative: 0 %
Eosinophils Absolute: 0 10*3/uL (ref 0.0–0.5)
Eosinophils Relative: 0 %
HCT: 33.9 % — ABNORMAL LOW (ref 39.0–52.0)
Hemoglobin: 11.3 g/dL — ABNORMAL LOW (ref 13.0–17.0)
Immature Granulocytes: 0 %
Lymphocytes Relative: 19 %
Lymphs Abs: 2.1 10*3/uL (ref 0.7–4.0)
MCH: 32.8 pg (ref 26.0–34.0)
MCHC: 33.3 g/dL (ref 30.0–36.0)
MCV: 98.5 fL (ref 80.0–100.0)
Monocytes Absolute: 1.1 10*3/uL — ABNORMAL HIGH (ref 0.1–1.0)
Monocytes Relative: 10 %
Neutro Abs: 7.7 10*3/uL (ref 1.7–7.7)
Neutrophils Relative %: 71 %
Platelets: 246 10*3/uL (ref 150–400)
RBC: 3.44 MIL/uL — ABNORMAL LOW (ref 4.22–5.81)
RDW: 12.8 % (ref 11.5–15.5)
WBC: 11 10*3/uL — ABNORMAL HIGH (ref 4.0–10.5)
nRBC: 0 % (ref 0.0–0.2)

## 2019-02-17 LAB — CBC
HCT: 32.9 % — ABNORMAL LOW (ref 39.0–52.0)
Hemoglobin: 11.3 g/dL — ABNORMAL LOW (ref 13.0–17.0)
MCH: 33.6 pg (ref 26.0–34.0)
MCHC: 34.3 g/dL (ref 30.0–36.0)
MCV: 97.9 fL (ref 80.0–100.0)
Platelets: 237 10*3/uL (ref 150–400)
RBC: 3.36 MIL/uL — ABNORMAL LOW (ref 4.22–5.81)
RDW: 12.8 % (ref 11.5–15.5)
WBC: 13.2 10*3/uL — ABNORMAL HIGH (ref 4.0–10.5)
nRBC: 0 % (ref 0.0–0.2)

## 2019-02-17 LAB — BASIC METABOLIC PANEL
Anion gap: 11 (ref 5–15)
Anion gap: 9 (ref 5–15)
BUN: 10 mg/dL (ref 8–23)
BUN: 9 mg/dL (ref 8–23)
CO2: 23 mmol/L (ref 22–32)
CO2: 25 mmol/L (ref 22–32)
Calcium: 8.1 mg/dL — ABNORMAL LOW (ref 8.9–10.3)
Calcium: 8.3 mg/dL — ABNORMAL LOW (ref 8.9–10.3)
Chloride: 103 mmol/L (ref 98–111)
Chloride: 103 mmol/L (ref 98–111)
Creatinine, Ser: 0.98 mg/dL (ref 0.61–1.24)
Creatinine, Ser: 1 mg/dL (ref 0.61–1.24)
GFR calc Af Amer: >60 mL/min (ref >60)
GFR calc Af Amer: >60 mL/min (ref >60)
GFR calc non Af Amer: >60 mL/min (ref >60)
GFR calc non Af Amer: >60 mL/min (ref >60)
Glucose, Bld: 108 mg/dL — ABNORMAL HIGH (ref 70–99)
Glucose, Bld: 100 mg/dL — ABNORMAL HIGH (ref 70–99)
Potassium: 3.6 mmol/L (ref 3.5–5.1)
Potassium: 3.6 mmol/L (ref 3.5–5.1)
Sodium: 137 mmol/L (ref 135–145)
Sodium: 137 mmol/L (ref 135–145)

## 2019-02-17 MED ORDER — DONEPEZIL HCL 5 MG PO TABS
5.0000 mg | ORAL_TABLET | Freq: Every day | ORAL | Status: DC
  Administered 2019-02-17: 5 mg via ORAL
  Filled 2019-02-17: qty 1

## 2019-02-17 MED ORDER — TRAZODONE HCL 50 MG PO TABS
150.0000 mg | ORAL_TABLET | Freq: Every day | ORAL | Status: DC
  Administered 2019-02-17: 150 mg via ORAL
  Filled 2019-02-17: qty 1

## 2019-02-17 MED ORDER — VENLAFAXINE HCL ER 150 MG PO CP24
150.0000 mg | ORAL_CAPSULE | Freq: Every day | ORAL | Status: DC
  Administered 2019-02-18: 150 mg via ORAL
  Filled 2019-02-17: qty 1

## 2019-02-17 MED ORDER — PROPRANOLOL HCL ER 60 MG PO CP24
120.0000 mg | ORAL_CAPSULE | Freq: Every day | ORAL | Status: DC
  Administered 2019-02-17 – 2019-02-18 (×2): 120 mg via ORAL
  Filled 2019-02-17: qty 2
  Filled 2019-02-17: qty 1
  Filled 2019-02-17: qty 2

## 2019-02-17 MED ORDER — AMITRIPTYLINE HCL 25 MG PO TABS
25.0000 mg | ORAL_TABLET | Freq: Every day | ORAL | Status: DC
  Administered 2019-02-17 – 2019-02-18 (×2): 25 mg via ORAL
  Filled 2019-02-17 (×3): qty 1

## 2019-02-17 MED ORDER — OXYCODONE HCL 5 MG PO TABS
5.0000 mg | ORAL_TABLET | Freq: Four times a day (QID) | ORAL | Status: DC | PRN
  Administered 2019-02-18: 5 mg via ORAL
  Filled 2019-02-17: qty 1

## 2019-02-17 NOTE — Telephone Encounter (Signed)
Office visit with Dr Rush Landmark on 12/1 at 230 pm.  Recall EGD in [place] the pt will be given the appt information at discharge.
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
## 2019-02-17 NOTE — Progress Notes (Addendum)
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 radiopaque foreign body in this location. Other: None. CT MAXILLOFACIAL FINDINGS Osseous: No fracture or mandibular dislocation. No destructive process. Orbits: Negative. No traumatic or inflammatory finding. Sinuses: Clear. Soft tissues: There is right periorbital soft tissue swelling. There are multiple radiopaque foreign bodies involving the soft tissues overlying the right temporal bone. There is poor dentition with multiple periapical lucencies and dental caries. CT CERVICAL SPINE FINDINGS Alignment: Normal. Skull base and vertebrae: No acute fracture. No primary bone lesion or focal pathologic process. Soft tissues and spinal canal: No prevertebral fluid or swelling. No visible canal hematoma. Disc levels: Multilevel disc height loss is noted throughout the cervical spine, greatest at the C6-C7 level. Upper chest: Negative. Other: None IMPRESSION: 1. No acute intracranial abnormality. 2. Right periorbital soft tissue swelling without evidence for an underlying fracture. 3.  Posterior vertex scalp swelling and laceration without evidence for an underlying calvarial fracture. 4. No acute cervical spine fracture. 5. Multiple radiopaque foreign bodies are noted overlying the right temporal bone. These are age indeterminate and may be related to the current or a remote injury. 6. Very poor dentition. Electronically Signed   By: Constance Holster M.D.   On: 02/15/2019 19:53    Scheduled Meds:  nicotine  21 mg Transdermal Daily   sodium chloride flush  3 mL Intravenous Q12H   sucralfate  1 g Oral TID WC & HS   Continuous Infusions:  ondansetron (ZOFRAN) IV 8 mg (02/17/19 0518)   pantoprozole (PROTONIX) infusion 8 mg/hr (02/17/19 1112)     LOS: 0 days   Time spent: 32 minutes   Darliss Cheney, MD [facility]  02/17/2019, 11:33 AM   To contact the attending provider between 7A-7P or the covering provider during after hours 7P-7A, please log into the web site [email or web address] and use password [email or web address].

## 2019-02-17 NOTE — Telephone Encounter (Signed)
-----   Message from Irving Copas., MD sent at 02/16/2019  3:18 PM EST ----- Chong Sicilian, The patient will be hospitalized for at least the next 24 to 48 hours. Can you reach out to him by midweek? This patient needs a GI clinic follow-up in approximately 4 weeks with me.  Okay to overbook. Please also move forward with scheduling an EGD in the [facility] in 2 to 2.5 months. I will follow-up his pathology when it returns. Thanks. GM

## 2019-02-18 ENCOUNTER — Inpatient Hospital Stay (HOSPITAL_COMMUNITY): Payer: No Typology Code available for payment source

## 2019-02-18 DIAGNOSIS — K922 Gastrointestinal hemorrhage, unspecified: Secondary | ICD-10-CM

## 2019-02-18 DIAGNOSIS — M25511 Pain in right shoulder: Secondary | ICD-10-CM

## 2019-02-18 DIAGNOSIS — R911 Solitary pulmonary nodule: Secondary | ICD-10-CM | POA: Diagnosis present

## 2019-02-18 LAB — SURGICAL PATHOLOGY

## 2019-02-18 MED ORDER — PANTOPRAZOLE SODIUM 40 MG PO TBEC: 40.0000 mg | DELAYED_RELEASE_TABLET | Freq: Every day | ORAL | 1 refills | Status: DC

## 2019-02-18 MED ORDER — OXYCODONE HCL 5 MG PO TABS: 5.0000 mg | ORAL_TABLET | Freq: Four times a day (QID) | ORAL | 0 refills | Status: DC | PRN
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
## 2019-02-18 NOTE — Discharge Summary (Signed)
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 daily. Centrum   naproxen sodium 220 MG tablet Commonly known as: ALEVE Take 220 mg by mouth 2 (two) times daily as needed (pain).   oxyCODONE 5 MG immediate release tablet Commonly known as: Oxy IR/ROXICODONE Take 1 tablet (5 mg total) by mouth every 6 (six) hours as needed for severe pain.   pantoprazole 40 MG tablet Commonly known as: Protonix Take 1 tablet (40 mg total) by mouth daily.   propranolol ER 120 MG 24 hr capsule Commonly known as: INDERAL LA Take 120 mg by mouth daily.   traZODone 150 MG tablet Commonly known as: DESYREL Take 150 mg by mouth at bedtime.   venlafaxine XR 75 MG 24 hr capsule Commonly known as: EFFEXOR-XR Take 150 mg by mouth daily with breakfast.   VITAMIN C PO Take 1 tablet by mouth every morning.            Durable Medical Equipment  (From admission, onward)         Start     Ordered   02/18/19 1307  For home use only DME Walker rolling  [facility])  Once      Question:  Patient needs a walker to treat with the following condition  Answer:  Functional gait disorder   02/18/19 1308         Follow-up Information    Reader.   Specialty: Emergency Medicine Why: 7-10 days for suture and staple removal. Contact information: [address] [id number] [place] [phone]           Time coordinating discharge: 35 minutes.  SignedMargreta Journey Serenitie Vinton  Pager [phone] [facility] 02/18/2019, 2:13 PM

## 2019-02-18 NOTE — TOC Transition Note (Signed)
Transition of Care [facility]) - CM/SW Discharge Note   Patient Details  Name: Willie Harmon MRN: [id number] Date of Birth: May 23, 1955  Transition of Care [facility]) CM/SW Contact:  Bartholomew Crews, RN Phone Number: [phone] 02/18/2019, 2:25 PM   Clinical Narrative:    Spoke with patient at bedside. Verified address in [place], [place]. MD order for [facility] PT, OT - no insurance and patient out of area for charity assistance. MD at bedside and aware. Encourage patient to continue exercises as demonstrated by acute therapy. Patient verbalized understanding. Patient agreeable to rolling walker. Request made to [employer]. MD to send prescriptions electronically to [place], [place] [facility]. Patient states that his dad will pick him up and that his dad only lives a block away. No further transition needs identified.    Final next level of care: Home/Self Care Barriers to Discharge: No Barriers Identified   Patient Goals and CMS Choice Patient states their goals for this hospitalization and ongoing recovery are:: be less achy CMS Medicare.gov Compare Post Acute Care list provided to:: Patient Choice offered to / list presented to : NA  Discharge Placement                       Discharge Plan and Services In-house Referral: NA Discharge Planning Services: CM Consult Post Acute Care Choice: NA          DME Arranged: Walker rolling DME Agency: [employer] Date DME Agency Contacted: 02/18/19 Time DME Agency Contacted: [phone] Representative spoke with at DME Agency: [place]: NA [place] Agency: NA        Social Determinants of Health ([place]) Interventions     Readmission Risk Interventions No flowsheet data found.

## 2019-02-18 NOTE — Progress Notes (Signed)
Roger Kill to be discharged Home  per MD order. Discussed prescriptions and follow up appointments with the patient. Prescriptions given to patient; medication list explained in detail. Patient verbalized understanding.  Skin clean, dry and intact without evidence of skin break down, no evidence of skin tears noted. IV catheter discontinued intact. Site without signs and symptoms of complications. Dressing and pressure applied. Pt denies pain at the site currently. No complaints noted.  Patient free of lines, drains, and wounds.   An After Visit Summary (AVS) was printed and given to the patient. Patient escorted via wheelchair, and discharged home via private auto.  Shela Commons, RN

## 2019-02-19 ENCOUNTER — Telehealth: Payer: Self-pay | Admitting: Gastroenterology

## 2019-02-19 ENCOUNTER — Encounter (HOSPITAL_COMMUNITY): Payer: Self-pay | Admitting: Gastroenterology
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
## 2019-02-21 ENCOUNTER — Other Ambulatory Visit: Payer: Self-pay

## 2019-02-21 ENCOUNTER — Encounter: Payer: Self-pay | Admitting: Gastroenterology

## 2019-02-21 MED ORDER — PANTOPRAZOLE SODIUM 40 MG PO TBEC: 40.0000 mg | DELAYED_RELEASE_TABLET | Freq: Two times a day (BID) | ORAL | 3 refills | Status: AC

## 2019-03-18 ENCOUNTER — Encounter: Payer: Self-pay | Admitting: Gastroenterology

## 2019-03-18 ENCOUNTER — Ambulatory Visit (INDEPENDENT_AMBULATORY_CARE_PROVIDER_SITE_OTHER): Payer: Medicare Other | Admitting: Gastroenterology

## 2019-03-18 ENCOUNTER — Other Ambulatory Visit (INDEPENDENT_AMBULATORY_CARE_PROVIDER_SITE_OTHER): Payer: Medicare Other

## 2019-03-18 VITALS — BP 134/78 | HR 68 | Temp 98.4°F | Ht 71.5 in | Wt 164.4 lb

## 2019-03-18 DIAGNOSIS — D649 Anemia, unspecified: Secondary | ICD-10-CM

## 2019-03-18 DIAGNOSIS — K226 Gastro-esophageal laceration-hemorrhage syndrome: Secondary | ICD-10-CM

## 2019-03-18 DIAGNOSIS — K209 Esophagitis, unspecified without bleeding: Secondary | ICD-10-CM | POA: Diagnosis not present

## 2019-03-18 DIAGNOSIS — R197 Diarrhea, unspecified: Secondary | ICD-10-CM

## 2019-03-18 DIAGNOSIS — R194 Change in bowel habit: Secondary | ICD-10-CM

## 2019-03-18 DIAGNOSIS — K2210 Ulcer of esophagus without bleeding: Secondary | ICD-10-CM

## 2019-03-18 DIAGNOSIS — K253 Acute gastric ulcer without hemorrhage or perforation: Secondary | ICD-10-CM | POA: Diagnosis not present

## 2019-03-18 LAB — IGA: IgA: 290 mg/dL (ref 68–378)

## 2019-03-18 LAB — CBC
HCT: 36 % — ABNORMAL LOW (ref 39.0–52.0)
Hemoglobin: 12 g/dL — ABNORMAL LOW (ref 13.0–17.0)
MCHC: 33.4 g/dL (ref 30.0–36.0)
MCV: 97.9 fl (ref 78.0–100.0)
Platelets: 334 10*3/uL (ref 150.0–400.0)
RBC: 3.68 Mil/uL — ABNORMAL LOW (ref 4.22–5.81)
RDW: 12.3 % (ref 11.5–15.5)
WBC: 11.7 10*3/uL — ABNORMAL HIGH (ref 4.0–10.5)

## 2019-03-18 LAB — TSH: TSH: 1.24 u[IU]/mL (ref 0.35–4.50)

## 2019-03-18 LAB — FOLATE: Folate: >24.1 ng/mL (ref >5.9)

## 2019-03-18 LAB — IBC + FERRITIN
Ferritin: 77.9 ng/mL (ref 22.0–322.0)
Iron: 91 ug/dL (ref 42–165)
Saturation Ratios: 36.1 % (ref 20.0–50.0)
Transferrin: 180 mg/dL — ABNORMAL LOW (ref 212.0–360.0)

## 2019-03-18 LAB — VITAMIN B12: Vitamin B-12: >1500 pg/mL — ABNORMAL HIGH (ref 211–911)
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
## 2019-03-18 NOTE — Patient Instructions (Addendum)
Stop Pantoprazole for the next few days and let us know how your doing. If better by Friday then we decide about continuing at that time.   It has been recommended to you by your physician that you have a(n) Colon/Endo completed. Per your request, we did not schedule the procedure(s) today. Please contact our office at [phone] should you decide to have the procedure completed. You will be scheduled for a pre-visit and procedure at that time.  If you are age 63 or older, your body mass index should be between 23-30. Your Body mass index is 22.61 kg/m. If this is out of the aforementioned range listed, please consider follow up with your Primary Care Provider.  If you are age 42 or younger, your body mass index should be between 19-25. Your Body mass index is 22.61 kg/m. If this is out of the aformentioned range listed, please consider follow up with your Primary Care Provider.    Your provider has requested that you go to the basement level for lab work before leaving today. Press "B" on the elevator. The lab is located at the first door on the left as you exit the elevator..  We will request records from Dr. Guadalupe Maple at [facility].   Thank you for choosing me and [facility].  Dr. Rush Landmark

## 2019-03-19 ENCOUNTER — Other Ambulatory Visit: Payer: Medicare Other

## 2019-03-19 DIAGNOSIS — R197 Diarrhea, unspecified: Secondary | ICD-10-CM

## 2019-03-19 DIAGNOSIS — K209 Esophagitis, unspecified without bleeding: Secondary | ICD-10-CM | POA: Insufficient documentation

## 2019-03-19 DIAGNOSIS — K2210 Ulcer of esophagus without bleeding: Secondary | ICD-10-CM | POA: Insufficient documentation

## 2019-03-19 DIAGNOSIS — D649 Anemia, unspecified: Secondary | ICD-10-CM | POA: Insufficient documentation

## 2019-03-19 DIAGNOSIS — K253 Acute gastric ulcer without hemorrhage or perforation: Secondary | ICD-10-CM | POA: Insufficient documentation

## 2019-03-19 LAB — TISSUE TRANSGLUTAMINASE, IGA: (tTG) Ab, IgA: 1 U/mL

## 2019-03-19 LAB — HEPATITIS C ANTIBODY
Hepatitis C Ab: NONREACTIVE
SIGNAL TO CUT-OFF: 0.01 (ref <1.00)

## 2019-03-20 DIAGNOSIS — K226 Gastro-esophageal laceration-hemorrhage syndrome: Secondary | ICD-10-CM | POA: Insufficient documentation

## 2019-03-20 DIAGNOSIS — R194 Change in bowel habit: Secondary | ICD-10-CM | POA: Insufficient documentation

## 2019-03-26 LAB — STOOL CULTURE
MICRO NUMBER:: 1155465
MICRO NUMBER:: 1155466
MICRO NUMBER:: 1155468
SHIGA RESULT:: NOT DETECTED
SPECIMEN QUALITY:: ADEQUATE
SPECIMEN QUALITY:: ADEQUATE
SPECIMEN QUALITY:: ADEQUATE

## 2019-03-26 LAB — PANCREATIC ELASTASE, FECAL: Pancreatic Elastase-1, Stool: 86 mcg/g — ABNORMAL LOW

## 2019-03-26 LAB — CLOSTRIDIUM DIFFICILE TOXIN B, QUALITATIVE, REAL-TIME PCR: Toxigenic C. Difficile by PCR: NOT DETECTED

## 2019-03-26 LAB — OVA AND PARASITE EXAMINATION
CONCENTRATE RESULT:: NONE SEEN
MICRO NUMBER:: 1155467
SPECIMEN QUALITY:: ADEQUATE
TRICHROME RESULT:: NONE SEEN

## 2019-03-28 NOTE — Progress Notes (Signed)
The patient has informed advanced RN Gerarda Fraction that he is feeling better. This is good news. However, he no longer wants to have any procedures performed. He must have a follow-up endoscopy due to my concern and my desire to make sure we are not missing a cancer of his esophagus.  If he wants to hold on colonoscopy I am fine with that but we must try to get him to come back for an upper endoscopy. We will have advanced RN Gerarda Fraction reach back out to the patient to let him know that we are fine with holding the colonoscopy if he is doing better but we need to try to do the upper endoscopy and I am okay to wait a few months if he is feeling better but I do not want this to go longer than 2 months from now.  Justice Britain, MD [facility] Advanced Endoscopy Office # [phone]

## 2019-04-30 ENCOUNTER — Encounter: Payer: Self-pay | Admitting: Gastroenterology

## 2019-04-30 ENCOUNTER — Ambulatory Visit (INDEPENDENT_AMBULATORY_CARE_PROVIDER_SITE_OTHER): Payer: Medicare Other | Admitting: Gastroenterology

## 2019-04-30 VITALS — BP 146/84 | HR 84 | Temp 98.1°F | Ht 71.5 in | Wt 161.4 lb

## 2019-04-30 DIAGNOSIS — Z8711 Personal history of peptic ulcer disease: Secondary | ICD-10-CM

## 2019-04-30 DIAGNOSIS — K8681 Exocrine pancreatic insufficiency: Secondary | ICD-10-CM

## 2019-04-30 DIAGNOSIS — Z8719 Personal history of other diseases of the digestive system: Secondary | ICD-10-CM

## 2019-04-30 DIAGNOSIS — K529 Noninfective gastroenteritis and colitis, unspecified: Secondary | ICD-10-CM | POA: Diagnosis not present

## 2019-04-30 DIAGNOSIS — K209 Esophagitis, unspecified without bleeding: Secondary | ICD-10-CM

## 2019-04-30 DIAGNOSIS — Z01818 Encounter for other preprocedural examination: Secondary | ICD-10-CM

## 2019-04-30 MED ORDER — SUPREP BOWEL PREP KIT 17.5-3.13-1.6 GM/177ML PO SOLN: 1.0000 | ORAL | 0 refills | Status: DC

## 2019-04-30 MED ORDER — PANCRELIPASE (LIP-PROT-AMYL) 36000-114000 UNITS PO CPEP: ORAL_CAPSULE | ORAL | 0 refills | Status: DC

## 2019-04-30 NOTE — Progress Notes (Signed)
[place] VISIT   [place] [address] [facility] [place] [address] [phone]  Patient Profile: Willie Harmon is a 64 y.o. male with a pmh significant for prior TBI, Dementia, HTN, Chronic Pain, Tobacco Use Disorder, Esophagitis, MW tear, PUD, ?EPI.  The patient presents to the [facility] for an evaluation and management of problem(s) noted below:  Problem List 1. Esophagitis   2. Exocrine pancreatic insufficiency   3. Chronic diarrhea   4. History of gastric ulcer   5. Encounter for other preprocedural examination     History of Present Illness Please see initial consultation note in the hospital as well as progress note for full details of HPI.  Interval History Today, the patient returns for further discussion and he is accompanied by his father once again.  And his last visit we discussed the role of repeat endoscopic evaluation for his esophagitis as well as gastric ulcer to ensure stability or improvement.  He also was having new symptoms of diarrhea.  Stool studies were negative for infectious etiologies but found a low fecal elastase.  He stopped his PPI therapy.  He is currently not on any PPI therapy.  He is not feeling any issues of dysphagia or heartburn currently.  Some abdominal discomfort that comes and goes in the midepigastrium.  It is variable in intensity as well as timing.  There is no prandial component to it then I can elicit from him.  No significant nausea or vomiting.  Denies any hematemesis or coffee-ground emesis.  He is not drinking alcohol at this time.  Overall symptoms of diarrhea have improved somewhat but he still going 2-4 times per day mostly 2-3 times.  Bowel movements are less liquidy but are still soft at least a Bristol scale 5 if not 6.  No blood in the stools.  No nocturnal symptoms of awakening him.  Having a bowel movement relieves some issues of bloating but is not always  consistent.  Having a bowel movement does not necessarily improve his abdominal pain when it occurs.  GI Review of Systems Positive as above including some bloating with distention at times Negative for nausea, vomiting, melena, hematochezia  Review of Systems General: Denies fevers/chills HEENT: Denies oral lesions Cardiovascular: Denies chest pain/palpitations Pulmonary: Denies shortness of breath/nocturnal cough Gastroenterological: See HPI Genitourinary: Denies darkened urine Hematological: Denies easy bruising/bleeding Dermatological: Denies jaundice Psychological: Mood is stable over the weekend understand is bowel movements of the better than before Musculoskeletal: No new arthralgias to describe   Medications Current Outpatient Medications  Medication Sig Dispense Refill  . acetaminophen (TYLENOL) 325 MG tablet Take 650 mg by mouth every 6 (six) hours as needed for mild pain.    Marland Kitchen amitriptyline (ELAVIL) 25 MG tablet Take 25 mg by mouth daily.    . Ascorbic Acid (VITAMIN C PO) Take 1 tablet by mouth every morning.    . celecoxib (CELEBREX) 200 MG capsule Take 200 mg by mouth daily.    Marland Kitchen donepezil (ARICEPT) 5 MG tablet Take 5 mg by mouth daily with supper.     . Multiple Vitamin (MULTIVITAMIN WITH MINERALS) TABS tablet Take 1 tablet by mouth daily. Centrum    . naproxen sodium (ALEVE) 220 MG tablet Take 220 mg by mouth 2 (two) times daily as needed (pain).    . nicotine (NICODERM CQ - DOSED IN MG/24 HOURS) 14 mg/24hr patch Place 14 mg onto the skin daily.    . pantoprazole (PROTONIX)  40 MG tablet Take 1 tablet (40 mg total) by mouth 2 (two) times daily. (Patient taking differently: Take 40 mg by mouth 2 (two) times daily. PT STATES TAKING ONLY ONCE DAILY PER PHARMACY) 180 tablet 3  . propranolol ER (INDERAL LA) 120 MG 24 hr capsule Take 120 mg by mouth daily.    . traZODone (DESYREL) 150 MG tablet Take 150 mg by mouth at bedtime.    Marland Kitchen venlafaxine XR (EFFEXOR-XR) 75 MG 24 hr  capsule Take 150 mg by mouth daily with breakfast.    . lipase/protease/amylase (CREON) 36000 UNITS CPEP capsule Week 1- take 1 capsule with each meal and snack.  Week 2- take 2 capsule with each meal and 1 capsule with each snack. 72 capsule 0  . Na Sulfate-K Sulfate-Mg Sulf (SUPREP BOWEL PREP KIT) 17.5-3.13-1.6 GM/177ML SOLN Take 1 kit by mouth as directed. For colonoscopy prep 354 mL 0   No current facility-administered medications for this visit.    Allergies No Known Allergies  Histories Past Medical History:  Diagnosis Date  . Dementia ([place])   . Tobacco abuse   . Traumatic brain injury [facility])    Past Surgical History:  Procedure Laterality Date  . BACK SURGERY     Notes having 2 prior back surgeries.  Marland Kitchen BIOPSY  02/16/2019   Procedure: BIOPSY;  Surgeon: Irving Copas., MD;  Location: [place];  Service: Gastroenterology;;  . ESOPHAGOGASTRODUODENOSCOPY N/A 02/16/2019   Procedure: ESOPHAGOGASTRODUODENOSCOPY (EGD);  Surgeon: Irving Copas., MD;  Location: [place];  Service: Gastroenterology;  Laterality: N/A;  . HEMOSTASIS CLIP PLACEMENT  02/16/2019   Procedure: HEMOSTASIS CLIP PLACEMENT;  Surgeon: Rush Landmark Telford Nab., MD;  Location: [place];  Service: Gastroenterology;;  . Metal plate in right leg     Social History   Socioeconomic History  . Marital status: Divorced    Spouse name: Not on file  . Number of children: 2  . Years of education: Not on file  . Highest education level: Not on file  Occupational History  . Occupation: DISABLED  Tobacco Use  . Smoking status: Current Every Day Smoker    Packs/day: 1.00    Types: Cigarettes  . Smokeless tobacco: Never Used  Substance and Sexual Activity  . Alcohol use: Yes    Comment: Reports occasional, but not daily use  . Drug use: Yes    Types: Marijuana  . Sexual activity: Not on file  Other Topics Concern  . Not on file  Social History Narrative  . Not on file   Social  Determinants of Health   Financial Resource Strain:   . Difficulty of Paying Living Expenses: Not on file  Food Insecurity:   . Worried About Charity fundraiser in the Last Year: Not on file  . Ran Out of Food in the Last Year: Not on file  Transportation Needs:   . Lack of Transportation (Medical): Not on file  . Lack of Transportation (Non-Medical): Not on file  Physical Activity:   . Days of Exercise per Week: Not on file  . Minutes of Exercise per Session: Not on file  Stress:   . Feeling of Stress : Not on file  Social Connections:   . Frequency of Communication with Friends and Family: Not on file  . Frequency of Social Gatherings with Friends and Family: Not on file  . Attends Religious Services: Not on file  . Active Member of Clubs or Organizations: Not on file  . Attends Club  or Organization Meetings: Not on file  . Marital Status: Not on file  Intimate Partner Violence:   . Fear of Current or Ex-Partner: Not on file  . Emotionally Abused: Not on file  . Physically Abused: Not on file  . Sexually Abused: Not on file   Family History  Problem Relation Age of Onset  . Cancer Mother   . Colon cancer Neg Hx   . Esophageal cancer Neg Hx   . Inflammatory bowel disease Neg Hx   . Liver disease Neg Hx   . Pancreatic cancer Neg Hx   . Rectal cancer Neg Hx   . Stomach cancer Neg Hx    I have reviewed his medical, social, and family history in detail and updated the electronic medical record as necessary.    PHYSICAL EXAMINATION  BP (!) 146/84   Pulse 84   Temp 98.1 F (36.7 C)   Ht 5' 11.5" (1.816 m)   Wt 161 lb 6 oz (73.2 kg)   BMI 22.19 kg/m  Wt Readings from Last 3 Encounters:  04/30/19 161 lb 6 oz (73.2 kg)  03/18/19 164 lb 6 oz (74.6 kg)  02/18/19 162 lb 0.6 oz (73.5 kg)  GEN: NAD, appears stated age, accompanied by father PSYCH: Cooperative, without pressured speech EYE: Conjunctivae pink, sclerae anicteric ENT: MMM CV: RR without R/Gs  RESP: CTAB  posteriorly, without wheezing GI: NABS, soft, NT/ND, without rebound or guarding, no HSM appreciated MSK/EXT: No lower extremity edema SKIN: No jaundice NEURO:  Alert & Oriented x 3, no focal deficits   REVIEW OF DATA  I reviewed the following data at the time of this encounter:  GI Procedures and Studies  No new relevant studies to review  Laboratory Studies  Reviewed that in Epic  Imaging Studies  No new relevant studies to review   ASSESSMENT  Mr. Legrand is a 64 y.o. male with a pmh significant for prior TBI, Dementia, HTN, Chronic Pain, Tobacco Use Disorder, Esophagitis, MW tear, PUD, ?EPI.  The patient presents for an evaluation and management of problem(s) noted below:  1. Esophagitis   2. Exocrine pancreatic insufficiency   3. Chronic diarrhea   4. History of gastric ulcer   5. Encounter for other preprocedural examination    The patient is hemodynamically stable and from a clinical perspective seems to be doing better in regards to his previous severe diarrhea.  He still having symptoms of loose stools but they are improved.  Stool studies and laboratories did not suggest any etiology other than the finding of a low fecal elastase.  He has a history of longer standing alcohol consumption.  He is certainly at risk of having exocrine pancreas insufficiency in the setting of potential chronic pancreatitis but no imaging studies at this time suggested that he has chronic pancreatitis.  I would like to rule out other etiologies by performing a colonoscopy to rule out inflammatory bowel disease as well as microscopic/collagenous colitis.  But I do think, that it would be worthwhile for [place] to give the patient samples and see how he does with pancreas enzyme replacement therapy.  We will start Creon as noted below.  If he seems to be doing well with that then we can always work with patient assistance to see how we can help the patient moving forward.  I need to know that the gastric  ulcer/distal esophagus ulcer has healed and we will plan a surveillance endoscopy at time of his colonoscopy.  He is  not using bulking agents and so I have asked him to once again consider FiberCon daily.  He wants to stay off PPI therapy because he may believe that it had some impact on the improvement of his diarrheal symptoms.  The risks and benefits of endoscopic evaluation were discussed with the patient; these include but are not limited to the risk of perforation, infection, bleeding, missed lesions, lack of diagnosis, severe illness requiring hospitalization, as well as anesthesia and sedation related illnesses.  The patient is agreeable to proceed.  All patient questions were answered, to the best of my ability, and the patient agrees to the aforementioned plan of action with follow-up as indicated.   PLAN  Proceed with surveillance endoscopy Proceed with diagnostic colonoscopy as well as screening colonoscopy to attempt TI intubation and random colon biopsies Likely needs to be started back on some sort of acid suppression and so if he is not willing to try PPI again because of potential diarrheal symptoms we will have to get him on an H2 RA Please consider starting FiberCon 1-2 times daily Creon samples given to patient Dosing will be, 72,000 units with each meal and 36,000 units with each snack -If he does well we will work on trying to get patient assistance   Orders Placed This Encounter  Procedures  . SARS Coronavirus 2 ([facility] ONLY)  . Ambulatory referral to Gastroenterology    New Prescriptions   LIPASE/PROTEASE/AMYLASE (CREON) 36000 UNITS CPEP CAPSULE    Week 1- take 1 capsule with each meal and snack.  Week 2- take 2 capsule with each meal and 1 capsule with each snack.   NA SULFATE-K SULFATE-MG SULF (SUPREP BOWEL PREP KIT) 17.5-3.13-1.6 GM/177ML SOLN    Take 1 kit by mouth as directed. For colonoscopy prep   Modified Medications   No medications on file     Planned Follow Up No follow-ups on file.   Total Time in Face-to-Face and in Coordination of Care for patient including review/personal interpretation of prior testing, medical history, examination, medication adjustment, documentation with the EHR is greater than 30 minutes.   Justice Britain, MD [facility] Advanced Endoscopy Office # [phone]

## 2019-04-30 NOTE — Patient Instructions (Addendum)
You have been scheduled for an endoscopy and colonoscopy. Please follow the written instructions given to you at your visit today. Please pick up your prep supplies at the pharmacy within the next 1-3 days. If you use inhalers (even only as needed), please bring them with you on the day of your procedure.  Due to recent changes in healthcare laws, you may see the results of your imaging and laboratory studies on MyChart before your provider has had a chance to review them.  We understand that in some cases there may be results that are confusing or concerning to you. Not all laboratory results come back in the same time frame and the provider may be waiting for multiple results in order to interpret others.  Please give [place] 48 hours in order for your provider to thoroughly review all the results before contacting the office for clarification of your results.   We have sent the following medications to your pharmacy for you to pick up at your convenience:  Suprep   Samples of Creon have been given today.   1. Start Creon 36,000mg  - week 1- take 1 capsule with each meal and snack.                                           -week 2- take 2 capsules with each meal and 1 capsule with snack.   Thank you for choosing me and [facility].  Dr. Rush Landmark

## 2019-05-01 DIAGNOSIS — Z8719 Personal history of other diseases of the digestive system: Secondary | ICD-10-CM | POA: Insufficient documentation

## 2019-05-01 DIAGNOSIS — K529 Noninfective gastroenteritis and colitis, unspecified: Secondary | ICD-10-CM | POA: Insufficient documentation

## 2019-05-01 DIAGNOSIS — Z01818 Encounter for other preprocedural examination: Secondary | ICD-10-CM | POA: Insufficient documentation

## 2019-05-01 DIAGNOSIS — Z8711 Personal history of peptic ulcer disease: Secondary | ICD-10-CM | POA: Insufficient documentation

## 2019-05-01 DIAGNOSIS — K8681 Exocrine pancreatic insufficiency: Secondary | ICD-10-CM | POA: Insufficient documentation

## 2019-05-23 ENCOUNTER — Ambulatory Visit (INDEPENDENT_AMBULATORY_CARE_PROVIDER_SITE_OTHER): Payer: Medicare Other

## 2019-05-23 ENCOUNTER — Other Ambulatory Visit: Payer: Self-pay | Admitting: Gastroenterology

## 2019-05-23 ENCOUNTER — Other Ambulatory Visit: Payer: Self-pay

## 2019-05-23 DIAGNOSIS — Z1159 Encounter for screening for other viral diseases: Secondary | ICD-10-CM

## 2019-05-24 LAB — SARS CORONAVIRUS 2 (TAT 6-24 HRS): SARS Coronavirus 2: NEGATIVE

## 2019-05-27 ENCOUNTER — Ambulatory Visit (AMBULATORY_SURGERY_CENTER): Payer: Medicare Other | Admitting: Gastroenterology

## 2019-05-27 ENCOUNTER — Encounter: Payer: Self-pay | Admitting: Gastroenterology

## 2019-05-27 ENCOUNTER — Other Ambulatory Visit: Payer: Self-pay

## 2019-05-27 VITALS — BP 118/68 | HR 60 | Temp 96.6°F | Resp 12 | Ht 71.0 in | Wt 161.0 lb

## 2019-05-27 DIAGNOSIS — K635 Polyp of colon: Secondary | ICD-10-CM

## 2019-05-27 DIAGNOSIS — K529 Noninfective gastroenteritis and colitis, unspecified: Secondary | ICD-10-CM

## 2019-05-27 DIAGNOSIS — K2950 Unspecified chronic gastritis without bleeding: Secondary | ICD-10-CM | POA: Diagnosis not present

## 2019-05-27 DIAGNOSIS — K2270 Barrett's esophagus without dysplasia: Secondary | ICD-10-CM

## 2019-05-27 DIAGNOSIS — K52831 Collagenous colitis: Secondary | ICD-10-CM | POA: Diagnosis not present

## 2019-05-27 DIAGNOSIS — K2980 Duodenitis without bleeding: Secondary | ICD-10-CM | POA: Diagnosis not present

## 2019-05-27 DIAGNOSIS — K259 Gastric ulcer, unspecified as acute or chronic, without hemorrhage or perforation: Secondary | ICD-10-CM | POA: Diagnosis not present

## 2019-05-27 DIAGNOSIS — K3189 Other diseases of stomach and duodenum: Secondary | ICD-10-CM | POA: Diagnosis not present

## 2019-05-27 DIAGNOSIS — K209 Esophagitis, unspecified without bleeding: Secondary | ICD-10-CM

## 2019-05-27 DIAGNOSIS — D127 Benign neoplasm of rectosigmoid junction: Secondary | ICD-10-CM

## 2019-05-27 MED ORDER — SODIUM CHLORIDE 0.9 % IV SOLN: 500.0000 mL | Freq: Once | INTRAVENOUS | Status: DC

## 2019-05-27 MED ORDER — ESOMEPRAZOLE MAGNESIUM 40 MG PO CPDR: 40.0000 mg | DELAYED_RELEASE_CAPSULE | Freq: Every day | ORAL | 3 refills | Status: DC

## 2019-05-27 NOTE — Progress Notes (Signed)
Pt tolerated well. VSS. Awake and to recovery. 

## 2019-05-27 NOTE — Progress Notes (Signed)
VS by DT. Temp by JB. 

## 2019-05-27 NOTE — Progress Notes (Signed)
Called to room to assist during endoscopic procedure.  Patient ID and intended procedure confirmed with present staff. Received instructions for my participation in the procedure from the performing physician.  

## 2019-05-27 NOTE — Op Note (Signed)
[place] Patient Name: Willie Harmon Procedure Date: 05/27/2019 12:52 PM MRN: [id number] Endoscopist: Justice Britain , MD Age: 64 Referring MD:  Date of Birth: 1955-12-13 Gender: Male Account #: [id number] Procedure:                Colonoscopy Indications:              Chronic diarrhea, Clinically significant diarrhea                            of unexplained origin - EPI being treated but we                            need to rule out other etiologies as well Medicines:                Monitored Anesthesia Care Procedure:                Pre-Anesthesia Assessment:                           - Prior to the procedure, a History and Physical                            was performed, and patient medications and                            allergies were reviewed. The patient's tolerance of                            previous anesthesia was also reviewed. The risks                            and benefits of the procedure and the sedation                            options and risks were discussed with the patient.                            All questions were answered, and informed consent                            was obtained. Prior Anticoagulants: The patient has                            taken no previous anticoagulant or antiplatelet                            agents. ASA Grade Assessment: III - A patient with                            severe systemic disease. After reviewing the risks                            and benefits, the patient was deemed in  satisfactory condition to undergo the procedure.                           After obtaining informed consent, the colonoscope                            was passed under direct vision. Throughout the                            procedure, the patient's blood pressure, pulse, and                            oxygen saturations were monitored continuously. The                            Colonoscope  was introduced through the anus and                            advanced to the the cecum, identified by                            appendiceal orifice and ileocecal valve. The                            colonoscopy was performed without difficulty. The                            patient tolerated the procedure. The quality of the                            bowel preparation was adequate. The ileocecal                            valve, appendiceal orifice, and rectum were                            photographed. Scope In: 1:52:02 PM Scope Out: 2:15:21 PM Scope Withdrawal Time: 0 hours 15 minutes 48 seconds  Total Procedure Duration: 0 hours 23 minutes 19 seconds  Findings:                 The digital rectal exam findings include                            hemorrhoids. Pertinent negatives include no                            palpable rectal lesions.                           Two sessile polyps were found in the recto-sigmoid                            colon. The polyps were 1 to 2 mm in size. These  polyps were removed with a cold biopsy forceps.                            Resection and retrieval were complete.                           Normal mucosa was found in the entire colon                            otherwise. Biopsies were taken with a cold forceps                            for histology to rule out Microscopic colitis and                            Chronic colitis.                           Non-bleeding non-thrombosed external and internal                            hemorrhoids were found during retroflexion, during                            perianal exam and during digital exam. The                            hemorrhoids were Grade II (internal hemorrhoids                            that prolapse but reduce spontaneously). Complications:            No immediate complications. Estimated Blood Loss:     Estimated blood loss was minimal. Impression:                - Hemorrhoids found on digital rectal exam.                           - Two 1 to 2 mm polyps at the recto-sigmoid colon,                            removed with a cold biopsy forceps. Resected and                            retrieved.                           - Normal mucosa in the entire examined colon                            otherwise. Biopsied.                           - Non-bleeding non-thrombosed external and internal  hemorrhoids. Recommendation:           - The patient will be observed post-procedure,                            until all discharge criteria are met.                           - Discharge patient to home.                           - Patient has a contact number available for                            emergencies. The signs and symptoms of potential                            delayed complications were discussed with the                            patient. Return to normal activities tomorrow.                            Written discharge instructions were provided to the                            patient.                           - High fiber diet.                           - Continue Creon. We will plan to send a                            prescription to your pharmacy to see if you will be                            able to get it covered through Medicaid. We will                            also move forward with trying to get you the                            Patient Assistance Program through Creon in regards                            to covering the medications if your insurance does                            not cover it. We will check with the clinic                            downstairs to see if we have any additional samples  that we can get you in the interim.                           - Await pathology results.                           - Repeat colonoscopy for surveillance based on                             pathology results.                           - Follow up in clinic in 4-6 weeks.                           - The findings and recommendations were discussed                            with the patient.                           - The findings and recommendations were discussed                            with the patient's family. Justice Britain, MD 05/27/2019 2:39:26 PM

## 2019-05-27 NOTE — Patient Instructions (Signed)
HANDOUTS PROVIDED ON: HIGH FIBER DIET, POLYPS, & HEMORRHOIDS  The polyps removed/biopsies taken today have been sent for pathology.  The results can take 1-3 weeks to receive.  When your next colonoscopy should occur will be based on the pathology results.    You may resume your previous diet and medication schedule.  Continue the Creon.  Start taking Nexium 40mg  once daily.  Thank you for allowing [place] to care for you today!!!   YOU HAD AN ENDOSCOPIC PROCEDURE TODAY AT [place]:   Refer to the procedure report that was given to you for any specific questions about what was found during the examination.  If the procedure report does not answer your questions, please call your gastroenterologist to clarify.  If you requested that your care partner not be given the details of your procedure findings, then the procedure report has been included in a sealed envelope for you to review at your convenience later.  YOU SHOULD EXPECT: Some feelings of bloating in the abdomen. Passage of more gas than usual.  Walking can help get rid of the air that was put into your GI tract during the procedure and reduce the bloating. If you had a lower endoscopy (such as a colonoscopy or flexible sigmoidoscopy) you may notice spotting of blood in your stool or on the toilet paper. If you underwent a bowel prep for your procedure, you may not have a normal bowel movement for a few days.  Please Note:  You might notice some irritation and congestion in your nose or some drainage.  This is from the oxygen used during your procedure.  There is no need for concern and it should clear up in a day or so.  SYMPTOMS TO REPORT IMMEDIATELY:   Following lower endoscopy (colonoscopy or flexible sigmoidoscopy):  Excessive amounts of blood in the stool  Significant tenderness or worsening of abdominal pains  Swelling of the abdomen that is new, acute  Fever of 100F or higher   Following upper endoscopy  (EGD)  Vomiting of blood or coffee ground material  New chest pain or pain under the shoulder blades  Painful or persistently difficult swallowing  New shortness of breath  Fever of 100F or higher  Black, tarry-looking stools  For urgent or emergent issues, a gastroenterologist can be reached at any hour by calling [phone].   DIET:  We do recommend a small meal at first, but then you may proceed to your regular diet.  Drink plenty of fluids but you should avoid alcoholic beverages for 24 hours.  ACTIVITY:  You should plan to take it easy for the rest of today and you should NOT DRIVE or use heavy machinery until tomorrow (because of the sedation medicines used during the test).    FOLLOW UP: Our staff will call the number listed on your records 48-72 hours following your procedure to check on you and address any questions or concerns that you may have regarding the information given to you following your procedure. If we do not reach you, we will leave a message.  We will attempt to reach you two times.  During this call, we will ask if you have developed any symptoms of COVID 19. If you develop any symptoms (ie: fever, flu-like symptoms, shortness of breath, cough etc.) before then, please call [phone].  If you test positive for Covid 19 in the 2 weeks post procedure, please call and report this information to [place].    If  any biopsies were taken you will be contacted by phone or by letter within the next 1-3 weeks.  Please call us at [phone] if you have not heard about the biopsies in 3 weeks.    SIGNATURES/CONFIDENTIALITY: You and/or your care partner have signed paperwork which will be entered into your electronic medical record.  These signatures attest to the fact that that the information above on your After Visit Summary has been reviewed and is understood.  Full responsibility of the confidentiality of this discharge information lies with you and/or your  care-partner.

## 2019-05-27 NOTE — Op Note (Signed)
[facility] Patient Name: Junichi Boas Procedure Date: 05/27/2019 12:52 PM MRN: [id number] Endoscopist: Justice Britain , MD Age: 64 Referring MD:  Date of Birth: 1955/08/10 Gender: Male Account #: [id number] Procedure:                Upper GI endoscopy Indications:              Follow-up of gastric ulcer, Follow-up of                            esophagitis, Diarrhea Medicines:                Monitored Anesthesia Care Procedure:                Pre-Anesthesia Assessment:                           - Prior to the procedure, a History and Physical                            was performed, and patient medications and                            allergies were reviewed. The patient's tolerance of                            previous anesthesia was also reviewed. The risks                            and benefits of the procedure and the sedation                            options and risks were discussed with the patient.                            All questions were answered, and informed consent                            was obtained. Prior Anticoagulants: The patient has                            taken no previous anticoagulant or antiplatelet                            agents. ASA Grade Assessment: III - A patient with                            severe systemic disease. After reviewing the risks                            and benefits, the patient was deemed in                            satisfactory condition to undergo the procedure.  After obtaining informed consent, the endoscope was                            passed under direct vision. Throughout the                            procedure, the patient's blood pressure, pulse, and                            oxygen saturations were monitored continuously. The                            Endoscope was introduced through the mouth, and                            advanced to the second part of  duodenum. The upper                            GI endoscopy was accomplished without difficulty.                            The patient tolerated the procedure. Scope In: Scope Out: Findings:                 No gross mucosal lesions were noted in the entire                            esophagus. Esophagitis has healed.                           Two small blebs were found in the distal esophagus,                            40 cm from the incisors. Did not have the                            appearance of true varices.                           One superficial esophageal ulcer was found 41 cm                            from the incisors at the [employer] (this is much                            improved from his initial EGD at time of his                            hospitalization). The lesion was 15 mm in largest                            dimension but was really just along the [employer]  encircling it. Biopsies were taken with a cold                            forceps for histology.                           Small amount of Hematin (altered                            blood/coffee-ground-like material) was found in the                            entire examined stomach. Lavage of the area was                            performed using copious amounts, resulting in                            clearance with good visualization.                           Multiple dispersed small erosions with stigmata of                            recent bleeding were found in the gastric body and                            in the gastric antrum.                           No other gross lesions were noted in the entire                            examined stomach. Biopsies were taken with a cold                            forceps for histology and Helicobacter pylori                            testing.                           Patchy mildly erythematous mucosa without active                             bleeding and with no stigmata of bleeding was found                            in the first portion of the duodenum and in the                            second portion of the duodenum. Biopsies were taken  with a cold forceps for histology and to rule out                            enteropathy. Complications:            No immediate complications. Estimated Blood Loss:     Estimated blood loss was minimal. Impression:               - No gross mucosal lesions in esophagus -                            esophagitis has healed. Two blebs found in the                            esophagus. Esophageal ulcer at [employer] is                            healing and much improved but still some presences.                            Biopsied.                           - Hematin (altered blood/coffee-ground-like                            material) in the entire stomach. Lavaged away.                            Erosive gastropathy with stigmata of recent                            bleeding. Biopsied.                           - Erythematous duodenopathy. Biopsied. Recommendation:           - Proceed to scheduled colonoscopy.                           - Await pathology results.                           - Observe patient's clinical course.                           - Start Nexium 40 mg daily. There was concern                            previously about Protonix use and diarrhea that                            could have been associated with this, but we will                            plan to attempt use of this as the patient still  has indications with ulcer (although healing) as                            well as erosions.                           - Stop all NSAID use as able, otherwise needs to                            remain on a low-dose PPI moving forward or at least                            a H2RA (though will be  less efficacious) for                            healing purposes.                           - Recommend follow up EGD in 2-3 months. I will see                            him in the interim, but I want to ensure the                            erosions are healing but also consider the role of                            an EUS to evaluate the prancreas for Chronic                            Pancreatitis evaluation. Plan follow up in clinic                            in 4-6 weeks.                           - The findings and recommendations were discussed                            with the patient.                           - The findings and recommendations were discussed                            with the patient's family. Justice Britain, MD 05/27/2019 2:34:18 PM

## 2019-05-29 ENCOUNTER — Telehealth: Payer: Self-pay

## 2019-05-29 MED ORDER — PANCRELIPASE (LIP-PROT-AMYL) 36000-114000 UNITS PO CPEP: ORAL_CAPSULE | ORAL | 3 refills | Status: DC

## 2019-05-29 NOTE — Telephone Encounter (Signed)
-----   Message from Irving Copas., MD sent at 05/27/2019  2:52 PM EST ----- Regarding: Creon patient assistance Willie Harmon,Can you go ahead and place a prescription for the patient for 72,000 units with each meal and 36,000 with each snack?I suspect that Medicaid is going to say no but we can try.I will move forward with trying to get patient assistance set up for him as well.I have given the patient and his father for 36,000 samples that we had in the sample cabinet, in case you need to document that for the patient.Thanks.GM

## 2019-05-29 NOTE — Telephone Encounter (Signed)
Rx for Creon sent to [facility].

## 2019-05-29 NOTE — Telephone Encounter (Signed)
  Follow up Call-  Call back number 05/27/2019  Post procedure Call Back phone  # [phone]  Permission to leave phone message Yes     Patient questions:  Do you have a fever, pain , or abdominal swelling? No. Pain Score  0 *  Have you tolerated food without any problems? Yes.    Have you been able to return to your normal activities? Yes.    Do you have any questions about your discharge instructions: Diet   No. Medications  No. Follow up visit  No.  Do you have questions or concerns about your Care? No.  Actions: * If pain score is 4 or above: No action needed, pain <4. 1. Have you developed a fever since your procedure? no  2.   Have you had an respiratory symptoms (SOB or cough) since your procedure? no  3.   Have you tested positive for COVID 19 since your procedure no  4.   Have you had any family members/close contacts diagnosed with the COVID 19 since your procedure?  no   If yes to any of these questions please route to Joylene John, RN and Alphonsa Gin, Therapist, sports.

## 2019-05-31 ENCOUNTER — Encounter: Payer: Self-pay | Admitting: Gastroenterology

## 2019-06-02 ENCOUNTER — Other Ambulatory Visit: Payer: Self-pay

## 2019-06-02 MED ORDER — BUDESONIDE 3 MG PO CPEP: ORAL_CAPSULE | ORAL | 0 refills | Status: DC

## 2019-06-02 NOTE — Telephone Encounter (Signed)
Pt requested for Creon script to be sent to [facility] in [place], [place].    Phone [phone]

## 2019-06-03 MED ORDER — PANCRELIPASE (LIP-PROT-AMYL) 36000-114000 UNITS PO CPEP: ORAL_CAPSULE | ORAL | 3 refills | Status: DC

## 2019-06-03 NOTE — Addendum Note (Signed)
Addended byDebbe Mounts on: 06/03/2019 09:32 AM   Modules accepted: Orders

## 2019-06-03 NOTE — Telephone Encounter (Signed)
Rx for Creon sent to [facility].

## 2019-06-10 ENCOUNTER — Telehealth: Payer: Self-pay | Admitting: Gastroenterology

## 2019-06-10 MED ORDER — BUDESONIDE 3 MG PO CPEP: ORAL_CAPSULE | ORAL | 0 refills | Status: DC

## 2019-06-10 NOTE — Telephone Encounter (Signed)
Rx for Budesonide was already sent to [facility] by Chong Sicilian- RN. I re-sent rx just to be certain. Pt has been improved.

## 2019-06-10 NOTE — Telephone Encounter (Signed)
Pt requested budesonide rx sent to [facility].

## 2019-06-11 MED ORDER — BUDESONIDE 3 MG PO CPEP: ORAL_CAPSULE | ORAL | 0 refills | Status: DC

## 2019-06-11 NOTE — Telephone Encounter (Signed)
Patient called stated [facility] is not covering the RX for Budesonide patient is requesting we send it to [facility]

## 2019-06-11 NOTE — Telephone Encounter (Signed)
Pt is calling wanting the RX call into [facility]

## 2019-06-11 NOTE — Addendum Note (Signed)
Addended byDebbe Mounts on: 06/11/2019 04:05 PM   Modules accepted: Orders

## 2019-06-11 NOTE — Telephone Encounter (Signed)
Rx sent to [facility]. If pt should need rx sent again, he will need to have pharmacy transfer rx's. We have sent multiple rx to different pharmacies. Pt has been informed as well.

## 2019-06-19 ENCOUNTER — Telehealth: Payer: Self-pay | Admitting: Gastroenterology

## 2019-06-19 NOTE — Telephone Encounter (Signed)
Patient called he would like for the medications [facility]on [facility]

## 2019-06-25 MED ORDER — PANCRELIPASE (LIP-PROT-AMYL) 36000-114000 UNITS PO CPEP: ORAL_CAPSULE | ORAL | 3 refills | Status: DC

## 2019-06-25 MED ORDER — BUDESONIDE 3 MG PO CPEP: ORAL_CAPSULE | ORAL | 0 refills | Status: DC

## 2019-06-25 MED ORDER — PANCRELIPASE (LIP-PROT-AMYL) 36000-114000 UNITS PO CPEP: ORAL_CAPSULE | ORAL | 3 refills | Status: AC

## 2019-06-25 NOTE — Telephone Encounter (Signed)
Patient is calling to follow up on request [facility] [place] and the patient is requesting a call back once sent.

## 2019-06-25 NOTE — Telephone Encounter (Addendum)
I have called and spoke with pt regarding his prescriptions. Pt advised that he needs to use one pharmacy (local) and if medications need to be sent mail order to tell us that when he calls. I have personally called [facility] they have prescriptions, will order Creon and call pt once RX is ready. Budesonide has been sent to [facility] as well.

## 2019-06-30 ENCOUNTER — Telehealth: Payer: Self-pay

## 2019-06-30 NOTE — Telephone Encounter (Signed)
Budesonide required PA- PA was done through covermymed and approved through 04/16/2020. [facility] pharmacist has been informed.

## 2019-07-25 ENCOUNTER — Telehealth: Payer: Self-pay | Admitting: Gastroenterology

## 2019-07-25 NOTE — Telephone Encounter (Signed)
Patient called wants to confirm that he has Crohns decease and if its contagious. Also wants to know for how long does he take the Creon medication

## 2019-07-25 NOTE — Telephone Encounter (Signed)
I explained to the patient he does not have Crohn's.  He has microscopic colitis.  All questions about meds answered

## 2019-08-18 ENCOUNTER — Other Ambulatory Visit: Payer: Self-pay | Admitting: Gastroenterology

## 2019-08-18 DIAGNOSIS — K259 Gastric ulcer, unspecified as acute or chronic, without hemorrhage or perforation: Secondary | ICD-10-CM

## 2019-08-18 DIAGNOSIS — K209 Esophagitis, unspecified without bleeding: Secondary | ICD-10-CM

## 2019-09-22 ENCOUNTER — Other Ambulatory Visit: Payer: Self-pay | Admitting: Gastroenterology
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
